# Patient Record
Sex: Male | Born: 1990 | Race: White | Hispanic: No | Marital: Single | Smoking: Never smoker
Health system: Southern US, Community
[De-identification: ages and names within clinical notes are randomized; demographics above are authoritative.]

---

## 2014-10-26 ENCOUNTER — Emergency Department (HOSPITAL_COMMUNITY)
Admission: EM | Admit: 2014-10-26 | Discharge: 2014-10-27 | Disposition: A | Discharge status: Home / Self Care | Payer: BLUE CROSS/BLUE SHIELD | Attending: Emergency Medicine | Admitting: Emergency Medicine

## 2014-10-26 ENCOUNTER — Encounter (HOSPITAL_COMMUNITY): Payer: Self-pay

## 2014-10-26 ENCOUNTER — Emergency Department (HOSPITAL_COMMUNITY): Payer: BLUE CROSS/BLUE SHIELD

## 2014-10-26 DIAGNOSIS — S53402A Unspecified sprain of left elbow, initial encounter: Secondary | ICD-10-CM | POA: Diagnosis not present

## 2014-10-26 DIAGNOSIS — S4992XA Unspecified injury of left shoulder and upper arm, initial encounter: Secondary | ICD-10-CM | POA: Diagnosis present

## 2014-10-26 DIAGNOSIS — Y92328 Other athletic field as the place of occurrence of the external cause: Secondary | ICD-10-CM | POA: Insufficient documentation

## 2014-10-26 DIAGNOSIS — Y998 Other external cause status: Secondary | ICD-10-CM | POA: Diagnosis not present

## 2014-10-26 DIAGNOSIS — W1839XA Other fall on same level, initial encounter: Secondary | ICD-10-CM | POA: Diagnosis not present

## 2014-10-26 DIAGNOSIS — Y9363 Activity, rugby: Secondary | ICD-10-CM | POA: Insufficient documentation

## 2014-10-26 MED ORDER — IBUPROFEN 200 MG PO TABS
600.0000 mg | ORAL_TABLET | Freq: Once | ORAL | Status: AC
  Administered 2014-10-26: 600 mg via ORAL
  Filled 2014-10-26: qty 3

## 2014-10-26 NOTE — ED Notes (Signed)
Patient reports he was playing rugby this afternoon when he was tackled.  His left arm was "locked" when he landed on it.  He has had decreased mobility of arm since injury.  No obvious deformity, pulse present.

## 2014-10-27 MED ORDER — IBUPROFEN 600 MG PO TABS: 600.0000 mg | ORAL_TABLET | Freq: Four times a day (QID) | ORAL | Status: DC | PRN

## 2014-10-27 NOTE — Discharge Instructions (Signed)
Joint Sprain °A sprain is a tear or stretch in the ligaments that hold a joint together. Severe sprains may need as long as 3-6 weeks of immobilization and/or exercises to heal completely. Sprained joints should be rested and protected. If not, they can become unstable and prone to re-injury. Proper treatment can reduce your pain, shorten the period of disability, and reduce the risk of repeated injuries. °TREATMENT  °· Rest and elevate the injured joint to reduce pain and swelling. °· Apply ice packs to the injury for 20-30 minutes every 2-3 hours for the next 2-3 days. °· Keep the injury wrapped in a compression bandage or splint as long as the joint is painful or as instructed by your caregiver. °· Do not use the injured joint until it is completely healed to prevent re-injury and chronic instability. Follow the instructions of your caregiver. °· Long-term sprain management may require exercises and/or treatment by a physical therapist. Taping or special braces may help stabilize the joint until it is completely better. °SEEK MEDICAL CARE IF:  °· You develop increased pain or swelling of the joint. °· You develop increasing redness and warmth of the joint. °· You develop a fever. °· It becomes stiff. °· Your hand or foot gets cold or numb. °Document Released: 09/30/2004 Document Revised: 11/15/2011 Document Reviewed: 09/09/2008 °Exit[employer]® Patient Information ©2015 [employer]. This information is not intended to replace advice given to you by your health care provider. Make sure you discuss any questions you have with your health care provider. ° °

## 2014-10-27 NOTE — ED Provider Notes (Signed)
CSN: [id number]     Arrival date & time 10/26/14  2202 History   First MD Initiated Contact with Patient 10/26/14 2310     Chief Complaint  Patient presents with  . Arm Injury     (Consider location/radiation/quality/duration/timing/severity/associated sxs/prior Treatment) HPI Patient states that this afternoon he fell onto outstretched left arm while playing rugby. He had immediate pain in the elbow. He did continue to play impressive again. Since that time he said increased pain and decreased movement of the left elbow. He is holding it in a flexed position. He denies any pain to the wrist or shoulder. Denies head or neck injury. Denies any numbness or weakness. History reviewed. No pertinent past medical history. History reviewed. No pertinent past surgical history. History reviewed. No pertinent family history. History  Substance Use Topics  . Smoking status: Never Smoker   . Smokeless tobacco: Not on file  . Alcohol Use: No    Review of Systems  Musculoskeletal: Positive for arthralgias. Negative for myalgias, back pain, neck pain and neck stiffness.  Skin: Negative for wound.  Neurological: Negative for syncope, weakness and numbness.  All other systems reviewed and are negative.     Allergies  Review of patient's allergies indicates no known allergies.  Home Medications   Prior to Admission medications   Medication Sig Start Date End Date Taking? Authorizing Provider  ibuprofen (ADVIL,MOTRIN) 600 MG tablet Take 1 tablet (600 mg total) by mouth every 6 (six) hours as needed. 10/27/14   Loren Raceravid Colene Mines, MD   BP 151/78 mmHg  Pulse 89  Temp(Src) 97.5 F (36.4 C) (Oral)  Resp 16  SpO2 99% Physical Exam  Constitutional: He is oriented to person, place, and time. He appears well-developed and well-nourished. No distress.  HENT:  Head: Normocephalic and atraumatic.  Eyes: EOM are normal. Pupils are equal, round, and reactive to light.  Neck: Normal range of motion.  Neck supple.  Cardiovascular: Normal rate.   Pulmonary/Chest: Effort normal.  Abdominal: Soft.  Musculoskeletal: He exhibits tenderness. He exhibits no edema.  Patient holding left elbow in a flexed position. Able to passively extend with some pain. Patient does have mild amount of tenderness at the radial head of the left elbow. There is no obvious swelling, contusion or deformity. Distal pulses intact. Range of motion of the left wrist and shoulder.  Neurological: He is alert and oriented to person, place, and time.  5/5 motor in the left upper extremity. Sensation is fully intact.  Skin: Skin is warm and dry. No rash noted. No erythema.  Psychiatric: He has a normal mood and affect. His behavior is normal.  Nursing note and vitals reviewed.   ED Course  Procedures (including critical care time) Labs Review Labs Reviewed - No data to display  Imaging Review Dg Elbow Complete Left  10/26/2014   CLINICAL DATA:  Patient reports he was playing rugby this afternoon when he was tackled. His left arm was "locked" when he landed on it. He has had decreased mobility of arm since injury. Pain in anterior/medial left elbow. No obvious deformity noted, pulse present. States no previous injury.  EXAM: LEFT ELBOW - COMPLETE 3+ VIEW  COMPARISON:  None.  FINDINGS: There is no evidence of fracture, dislocation, or joint effusion. There is no evidence of arthropathy or other focal bone abnormality. Soft tissues are unremarkable.  IMPRESSION: Negative.   Electronically Signed   By: Amie Portlandavid  Ormond M.D.   On: 10/26/2014 22:38     EKG  Interpretation None      MDM   Final diagnoses:  Elbow sprain, left, initial encounter    X-ray without any acute findings. Suspect left elbow sprain. Placed in sling. Encouraged range of motion exercises, ice and NSAIDs. We'll give follow-up with sports medicine. Return precautions given.    Loren Racer, MD 10/27/14 [phone]

## 2014-11-02 ENCOUNTER — Encounter (HOSPITAL_COMMUNITY): Payer: Self-pay | Admitting: Emergency Medicine

## 2014-11-02 ENCOUNTER — Emergency Department (HOSPITAL_COMMUNITY): Payer: BLUE CROSS/BLUE SHIELD

## 2014-11-02 ENCOUNTER — Emergency Department (HOSPITAL_COMMUNITY)
Admission: EM | Admit: 2014-11-02 | Discharge: 2014-11-02 | Disposition: A | Discharge status: Home / Self Care | Payer: BLUE CROSS/BLUE SHIELD | Attending: Emergency Medicine | Admitting: Emergency Medicine

## 2014-11-02 DIAGNOSIS — Y92328 Other athletic field as the place of occurrence of the external cause: Secondary | ICD-10-CM | POA: Diagnosis not present

## 2014-11-02 DIAGNOSIS — Y9363 Activity, rugby: Secondary | ICD-10-CM | POA: Insufficient documentation

## 2014-11-02 DIAGNOSIS — Y998 Other external cause status: Secondary | ICD-10-CM | POA: Diagnosis not present

## 2014-11-02 DIAGNOSIS — S62232A Other displaced fracture of base of first metacarpal bone, left hand, initial encounter for closed fracture: Secondary | ICD-10-CM | POA: Diagnosis not present

## 2014-11-02 DIAGNOSIS — S6992XA Unspecified injury of left wrist, hand and finger(s), initial encounter: Secondary | ICD-10-CM | POA: Diagnosis present

## 2014-11-02 DIAGNOSIS — W51XXXA Accidental striking against or bumped into by another person, initial encounter: Secondary | ICD-10-CM | POA: Diagnosis not present

## 2014-11-02 DIAGNOSIS — S6292XA Unspecified fracture of left wrist and hand, initial encounter for closed fracture: Secondary | ICD-10-CM

## 2014-11-02 MED ORDER — IBUPROFEN 800 MG PO TABS
800.0000 mg | ORAL_TABLET | Freq: Once | ORAL | Status: AC
  Administered 2014-11-02: 800 mg via ORAL
  Filled 2014-11-02: qty 1

## 2014-11-02 MED ORDER — IBUPROFEN 800 MG PO TABS: 800.0000 mg | ORAL_TABLET | Freq: Three times a day (TID) | ORAL | Status: AC

## 2014-11-02 MED ORDER — HYDROCODONE-ACETAMINOPHEN 5-325 MG PO TABS: 1.0000 | ORAL_TABLET | ORAL | Status: AC | PRN

## 2014-11-02 NOTE — ED Provider Notes (Signed)
CSN: [id number]     Arrival date & time 11/02/14  1935 History  This chart was scribed for non-physician practitioner, Elpidio Anis, PA-C, working with Merrie Roof, MD, by Abel Presto, ED Scribe. This patient was seen in room WTR7/WTR7 and the patient's care was started at 8:43 PM.    Chief Complaint  Patient presents with  . Hand Injury     Patient is a 24 y.o. male presenting with hand injury. The history is provided by the patient. No language interpreter was used.  Hand Injury Location:  Hand Hand location:  L hand Pain details:    Severity:  Moderate Handedness:  Right-handed Dislocation: no   Foreign body present:  No foreign bodies   HPI Comments: Ronald Zavala is a 24 y.o. male who presents to the Emergency Department complaining of fall at 10 AM this morning. Pt notes he was playing rugby at onset. Pt reports he was flipped over by his opponent and tried to break his fall with his left hand. At this point in time the pt states he heard a crack and experienced tingling in his left hand for 15 min. Pt states that the medic wrapped his hand in an ACE bandage and he continued to play till 2 PM this afternoon. Pt describes the pain as throbbing and exacerbated by movement. Pt denies LOC, head injury, and previous injuries to left hand. Pt reports the PMHx of broken left wrist and left elbow sprain.   History reviewed. No pertinent past medical history. History reviewed. No pertinent past surgical history. No family history on file. History  Substance Use Topics  . Smoking status: Never Smoker   . Smokeless tobacco: Not on file  . Alcohol Use: No    Review of Systems  Musculoskeletal: Positive for myalgias, joint swelling and arthralgias.  Neurological: Negative for syncope and headaches.      Allergies  Review of patient's allergies indicates no known allergies.  Home Medications   Prior to Admission medications   Medication Sig Start Date End Date  Taking? Authorizing Provider  ibuprofen (ADVIL,MOTRIN) 600 MG tablet Take 1 tablet (600 mg total) by mouth every 6 (six) hours as needed. 10/27/14   Loren Racer, MD   BP 136/77 mmHg  Pulse 99  Temp(Src) 98 F (36.7 C) (Oral)  Resp 16  SpO2 97% Physical Exam  Constitutional: He is oriented to person, place, and time. He appears well-developed and well-nourished.  HENT:  Head: Normocephalic.  Eyes: Conjunctivae are normal.  Neck: Normal range of motion. Neck supple.  Pulmonary/Chest: Effort normal.  Musculoskeletal: Normal range of motion.       Left hand: He exhibits tenderness. He exhibits normal range of motion. Normal sensation noted.  Left hand: normal ROM; large effusion over the 5th metacarpal that is TTP. Cap refill in all 5 digits is good.   Neurological: He is alert and oriented to person, place, and time.  DNV intact  Skin: Skin is warm and dry.  Psychiatric: He has a normal mood and affect. His behavior is normal.  Nursing note and vitals reviewed.   ED Course  Procedures (including critical care time) DIAGNOSTIC STUDIES: Oxygen Saturation is 97% on room air, normal by my interpretation.    COORDINATION OF CARE: 8:46 PM Discussed treatment plan with patient at beside, the patient agrees with the plan and has no further questions at this time.   Labs Review Labs Reviewed - No data to display  Imaging Review No  results found.   EKG Interpretation None      MDM   Final diagnoses:  None  1. Metacarpal fracture  Closed fracture base of left thumb. Case reviewed with Dr. Loretha StaplerWofford who advises thumb spica and hand referral. Pain management discussed with the patient. Stable for discharge.   I personally performed the services described in this documentation, which was scribed in my presence. The recorded information has been reviewed and is accurate.        Arnoldo HookerShari A Ceara Wrightson, PA-C 11/02/14 2152  Candyce ChurnJohn David Wofford III, MD 11/03/14 2114

## 2014-11-02 NOTE — ED Notes (Signed)
Pt states he injured his L hand today playing rugby. Pt states he fell on L hand. Swelling and pain to L hand near thumb. ROM decreased. Pt has had ice on hand PTA. Pt has not taken any pain medication. Pt alert, no acute distress. Skin warm and dry.

## 2014-11-02 NOTE — ED Notes (Signed)
Ice pack provided to hand

## 2014-11-02 NOTE — Discharge Instructions (Signed)
Hand Fracture, Metacarpals °Fractures of metacarpals are breaks in the bones of the hand. They extend from the knuckles to the wrist. These bones can undergo many types of fractures. There are different ways of treating these fractures, all of which may be correct. °TREATMENT  °Hand fractures can be treated with:  °· Non-reduction - The fracture is casted without changing the positions of the fracture (bone pieces) involved. This fracture is usually left in a cast for 4 to 6 weeks or as your caregiver thinks necessary. °· Closed reduction - The bones are moved back into position without surgery and then casted. °· ORIF (open reduction and internal fixation) - The fracture site is opened and the bone pieces are fixed into place with some type of hardware, such as screws, etc. They are then casted. °Your caregiver will discuss the type of fracture you have and the treatment that should be best for that problem. If surgery is chosen, let your caregivers know about the following.  °LET YOUR CAREGIVERS KNOW ABOUT: °· Allergies. °· Medications you are taking, including herbs, eye drops, over the counter medications, and creams. °· Use of steroids (by mouth or creams). °· Previous problems with anesthetics or novocaine. °· Possibility of pregnancy. °· History of blood clots (thrombophlebitis). °· History of bleeding or blood problems. °· Previous surgeries. °· Other health problems. °AFTER THE PROCEDURE °After surgery, you will be taken to the recovery area where a nurse will watch and check your progress. Once you are awake, stable, and taking fluids well, barring other problems, you'll be allowed to go home. Once home, an ice pack applied to your operative site may help with pain and keep the swelling down. °HOME CARE INSTRUCTIONS  °· Follow your caregiver's instructions as to activities, exercises, physical therapy, and driving a car. °· Daily exercise is helpful for keeping range of motion and strength. Exercise as  instructed. °· To lessen swelling, keep the injured hand elevated above the level of your heart as much as possible. °· Apply ice to the injury for 15-20 minutes each hour while awake for the first 2 days. Put the ice in a plastic bag and place a thin towel between the bag of ice and your cast. °· Move the fingers of your casted hand several times a day. °· If a plaster or fiberglass cast was applied: °¨ Do not try to scratch the skin under the cast using a sharp or pointed object. °¨ Check the skin around the cast every day. You may put lotion on red or sore areas. °¨ Keep your cast dry. Your cast can be protected during bathing with a plastic bag. Do not put your cast into the water. °· If a plaster splint was applied: °¨ Wear your splint for as long as directed by your caregiver or until seen again. °¨ Do not get your splint wet. Protect it during bathing with a plastic bag. °¨ You may loosen the elastic bandage around the splint if your fingers start to get numb, tingle, get cold or turn blue. °· Do not put pressure on your cast or splint; this may cause it to break. Especially, do not lean plaster casts on hard surfaces for 24 hours after application. °· Take medications as directed by your caregiver. °· Only take over-the-counter or prescription medicines for pain, discomfort, or fever as directed by your caregiver. °· Follow-up as provided by your caregiver. This is very important in order to avoid permanent injury or disability and chronic   pain. SEEK MEDICAL CARE IF:   Increased bleeding (more than a small spot) from beneath your cast or splint if there is beneath the cast as with an open reduction.  Redness, swelling, or increasing pain in the wound or from beneath your cast or splint.  Pus coming from wound or from beneath your cast or splint.  An unexplained oral temperature above 102 F (38.9 C) develops, or as your caregiver suggests.  A foul smell coming from the wound or dressing or from  beneath your cast or splint.  You have a problem moving any of your fingers. SEEK IMMEDIATE MEDICAL CARE IF:   You develop a rash  You have difficulty breathing  You have any allergy problems If you do not have a window in your cast for observing the wound, a discharge or minor bleeding may show up as a stain on the outside of your cast. Report these findings to your caregiver. MAKE SURE YOU:   Understand these instructions.  Will watch your condition.  Will get help right away if you are not doing well or get worse. Document Released: 08/23/2005 Document Revised: 11/15/2011 Document Reviewed: 04/11/2008 [facility] Patient Information 2015 [place], [place]. This information is not intended to replace advice given to you by your health care provider. Make sure you discuss any questions you have with your health care provider. Cryotherapy Cryotherapy means treatment with cold. Ice or gel packs can be used to reduce both pain and swelling. Ice is the most helpful within the first 24 to 48 hours after an injury or flare-up from overusing a muscle or joint. Sprains, strains, spasms, burning pain, shooting pain, and aches can all be eased with ice. Ice can also be used when recovering from surgery. Ice is effective, has very few side effects, and is safe for most people to use. PRECAUTIONS  Ice is not a safe treatment option for people with:  Raynaud phenomenon. This is a condition affecting small blood vessels in the extremities. Exposure to cold may cause your problems to return.  Cold hypersensitivity. There are many forms of cold hypersensitivity, including:  Cold urticaria. Red, itchy hives appear on the skin when the tissues begin to warm after being iced.  Cold erythema. This is a red, itchy rash caused by exposure to cold.  Cold hemoglobinuria. Red blood cells break down when the tissues begin to warm after being iced. The hemoglobin that carry oxygen are passed into the urine because  they cannot combine with blood proteins fast enough.  Numbness or altered sensitivity in the area being iced. If you have any of the following conditions, do not use ice until you have discussed cryotherapy with your caregiver:  Heart conditions, such as arrhythmia, angina, or chronic heart disease.  High blood pressure.  Healing wounds or open skin in the area being iced.  Current infections.  Rheumatoid arthritis.  Poor circulation.  Diabetes. Ice slows the blood flow in the region it is applied. This is beneficial when trying to stop inflamed tissues from spreading irritating chemicals to surrounding tissues. However, if you expose your skin to cold temperatures for too long or without the proper protection, you can damage your skin or nerves. Watch for signs of skin damage due to cold. HOME CARE INSTRUCTIONS Follow these tips to use ice and cold packs safely.  Place a dry or damp towel between the ice and skin. A damp towel will cool the skin more quickly, so you may need to shorten the  time that the ice is used.  For a more rapid response, add gentle compression to the ice.  Ice for no more than 10 to 20 minutes at a time. The bonier the area you are icing, the less time it will take to get the benefits of ice.  Check your skin after 5 minutes to make sure there are no signs of a poor response to cold or skin damage.  Rest 20 minutes or more between uses.  Once your skin is numb, you can end your treatment. You can test numbness by very lightly touching your skin. The touch should be so light that you do not see the skin dimple from the pressure of your fingertip. When using ice, most people will feel these normal sensations in this order: cold, burning, aching, and numbness.  Do not use ice on someone who cannot communicate their responses to pain, such as small children or people with dementia. HOW TO MAKE AN ICE PACK Ice packs are the most common way to use ice therapy.  Other methods include ice massage, ice baths, and cryosprays. Muscle creams that cause a cold, tingly feeling do not offer the same benefits that ice offers and should not be used as a substitute unless recommended by your caregiver. To make an ice pack, do one of the following:  Place crushed ice or a bag of frozen vegetables in a sealable plastic bag. Squeeze out the excess air. Place this bag inside another plastic bag. Slide the bag into a pillowcase or place a damp towel between your skin and the bag.  Mix 3 parts water with 1 part rubbing alcohol. Freeze the mixture in a sealable plastic bag. When you remove the mixture from the freezer, it will be slushy. Squeeze out the excess air. Place this bag inside another plastic bag. Slide the bag into a pillowcase or place a damp towel between your skin and the bag. SEEK MEDICAL CARE IF:  You develop white spots on your skin. This may give the skin a blotchy (mottled) appearance.  Your skin turns blue or pale.  Your skin becomes waxy or hard.  Your swelling gets worse. MAKE SURE YOU:   Understand these instructions.  Will watch your condition.  Will get help right away if you are not doing well or get worse. Document Released: 04/19/2011 Document Revised: 01/07/2014 Document Reviewed: 04/19/2011 [facility] Patient Information 2015 [place], [place]. This information is not intended to replace advice given to you by your health care provider. Make sure you discuss any questions you have with your health care provider.

## 2015-06-20 IMAGING — CR DG HAND COMPLETE 3+V*L*
3 series · 3 of 3 positions shown · non-contrast
Comparison: None.

CLINICAL DATA: Left thumb pain/ swelling, rugby injury

EXAM:
LEFT HAND - COMPLETE 3+ VIEW

[x hand pa left]
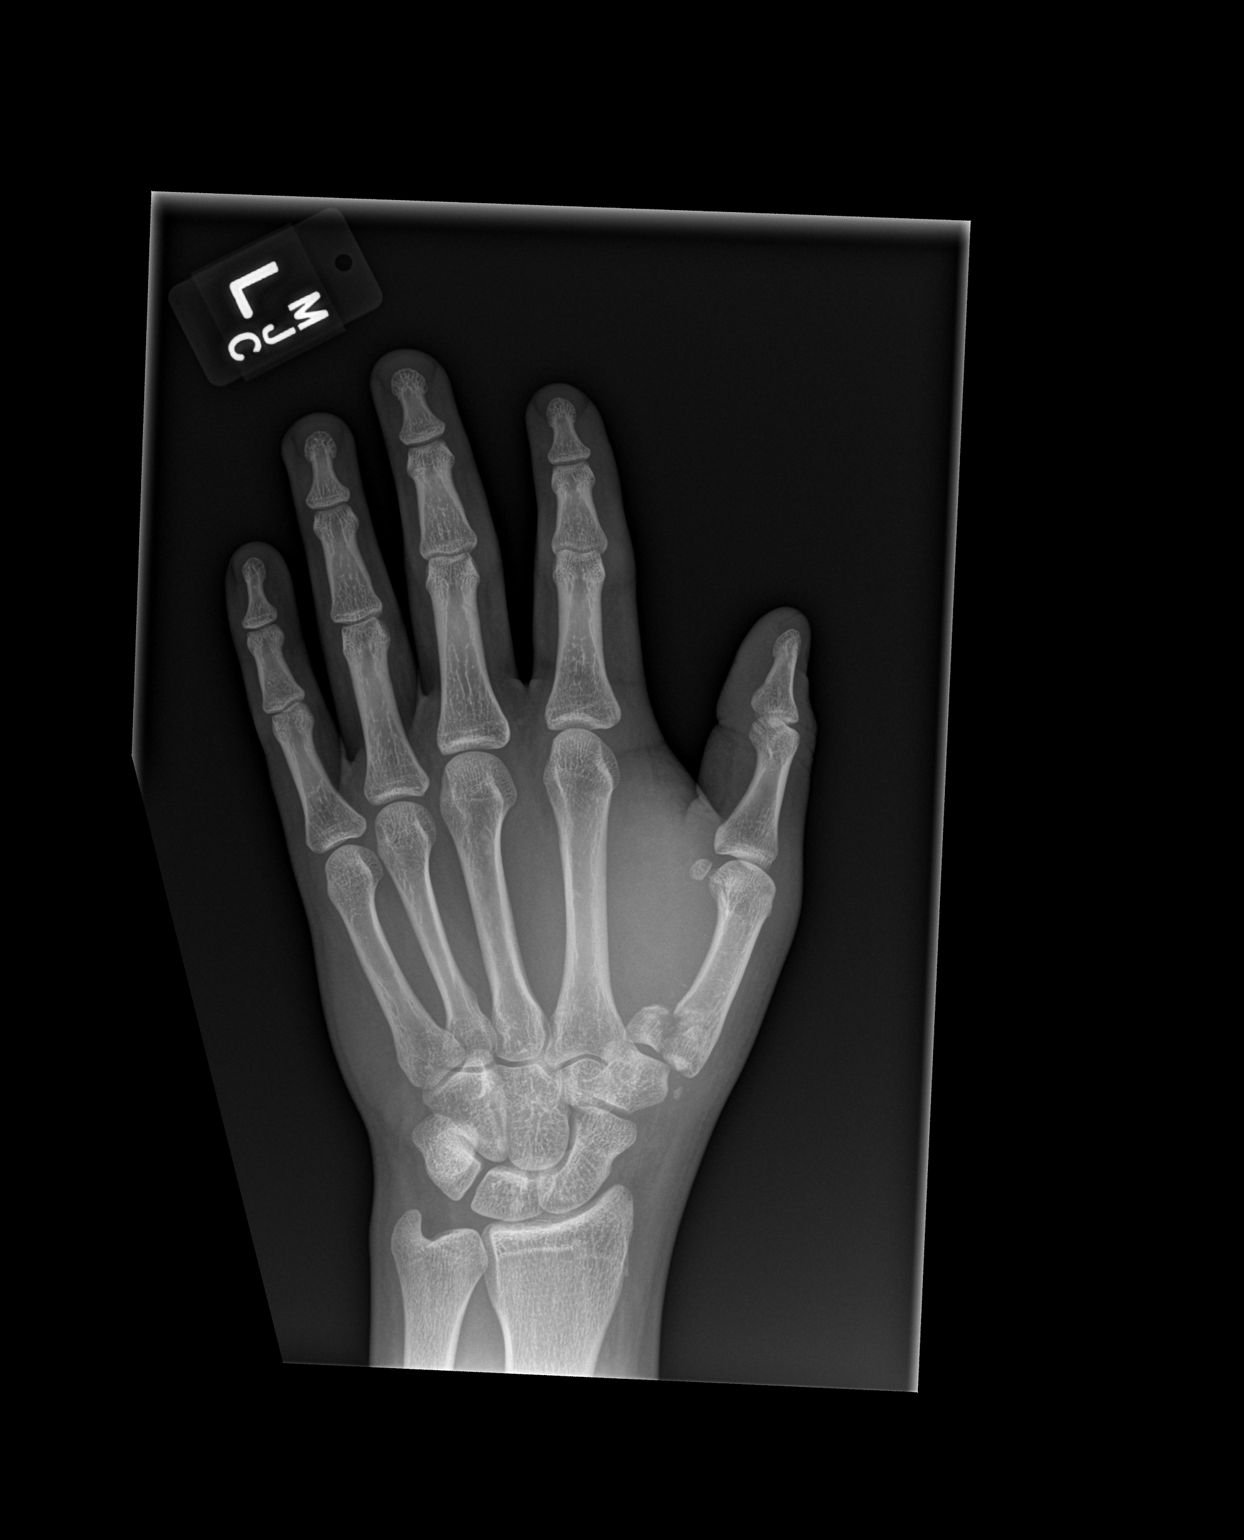

[x hand obl left]
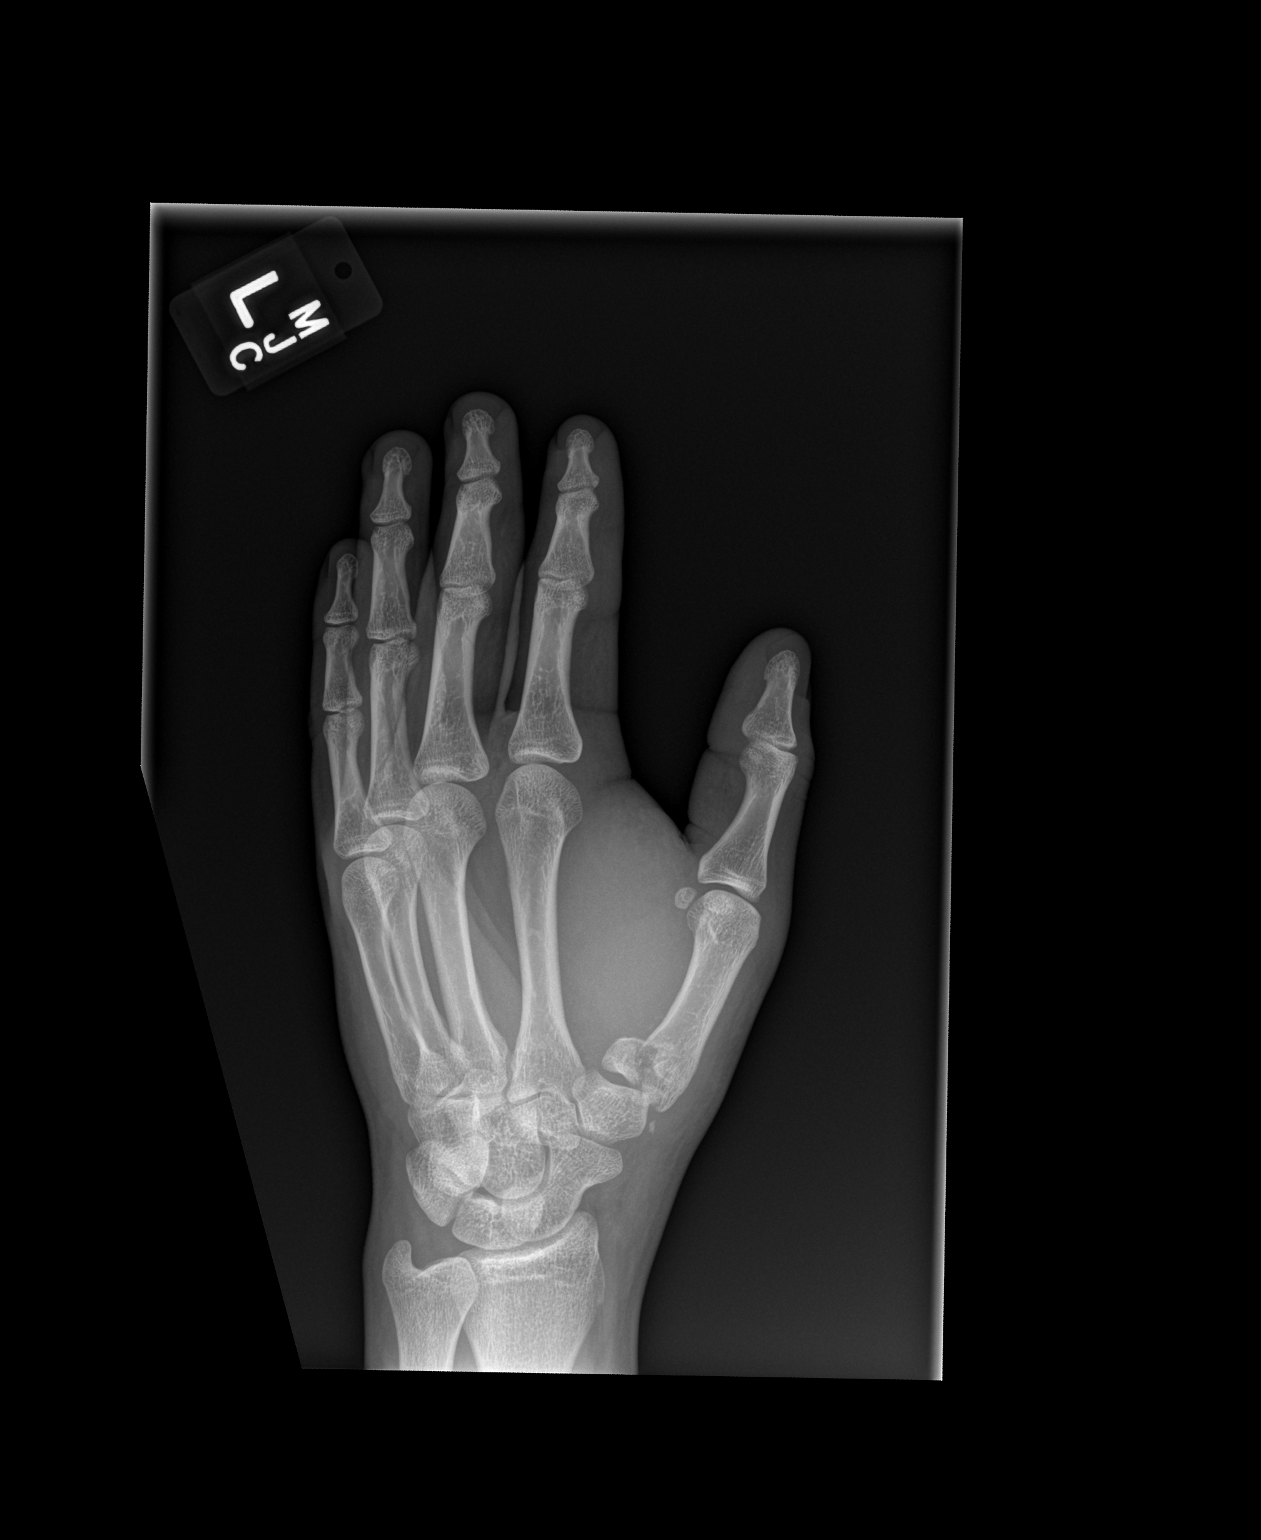

[x hand lat left]
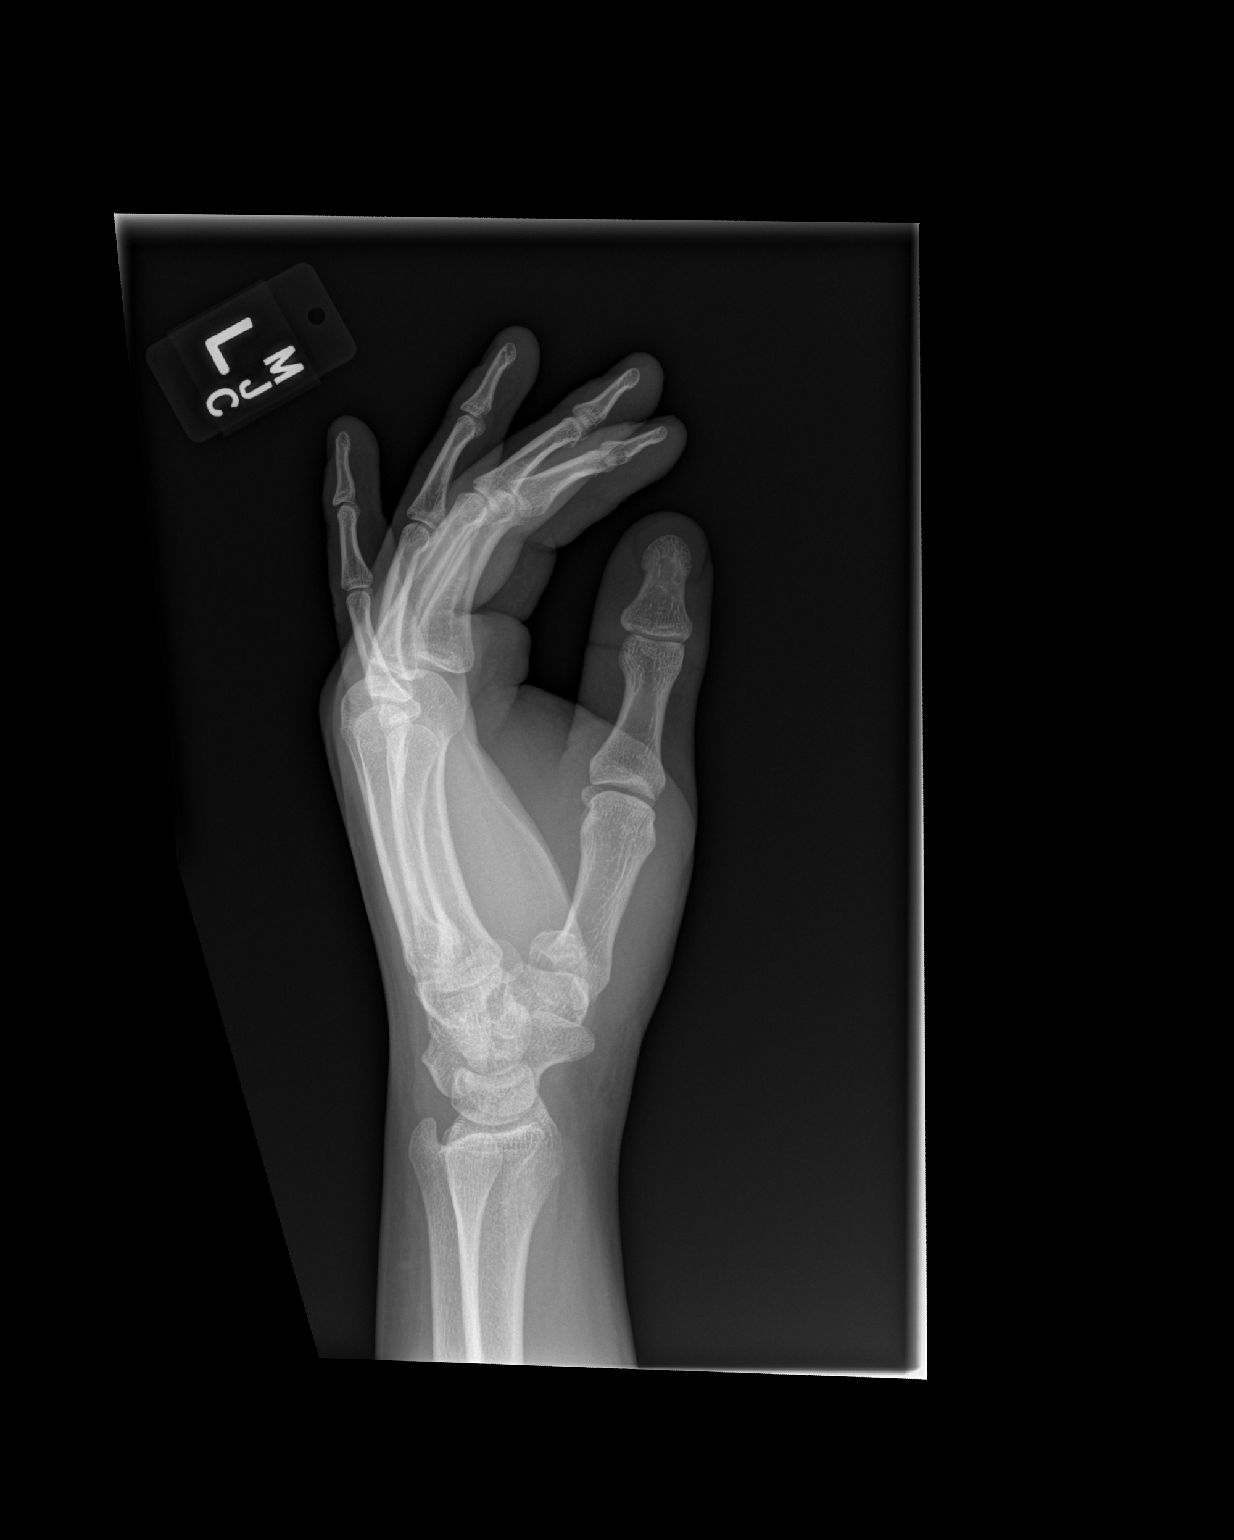

[3 of 3 positions shown; findings below may reference images not displayed]

FINDINGS: Mildly comminuted fracture involving the base of the 1st metacarpal.

No additional fracture is seen.

Mild soft tissue swelling.
IMPRESSION: Mildly comminuted fracture involving the base of the 1st metacarpal.
